# Patient Record
Sex: Male | Born: 1945 | Race: Black or African American | Hispanic: No | Marital: Married | Smoking: Former smoker
Health system: Southern US, Community
[De-identification: ages and names within clinical notes are randomized; demographics above are authoritative.]

---

## 2008-07-15 ENCOUNTER — Emergency Department (HOSPITAL_COMMUNITY): Admission: EM | Admit: 2008-07-15 | Discharge: 2008-07-15 | Payer: Self-pay | Admitting: Emergency Medicine

## 2010-07-17 LAB — BASIC METABOLIC PANEL
BUN: 25 mg/dL — ABNORMAL HIGH (ref 6–23)
Calcium: 9.2 mg/dL (ref 8.4–10.5)
GFR calc non Af Amer: 41 mL/min — ABNORMAL LOW (ref >60)
Glucose, Bld: 100 mg/dL — ABNORMAL HIGH (ref 70–99)
Sodium: 139 mEq/L (ref 135–145)

## 2011-01-05 ENCOUNTER — Emergency Department (HOSPITAL_COMMUNITY)
Admission: EM | Admit: 2011-01-05 | Discharge: 2011-01-05 | Disposition: A | Discharge status: Home / Self Care | Payer: BC Managed Care – PPO | Attending: Emergency Medicine | Admitting: Emergency Medicine

## 2011-01-05 ENCOUNTER — Encounter: Payer: Self-pay | Admitting: *Deleted

## 2011-01-05 DIAGNOSIS — Z79899 Other long term (current) drug therapy: Secondary | ICD-10-CM | POA: Insufficient documentation

## 2011-01-05 DIAGNOSIS — M25559 Pain in unspecified hip: Secondary | ICD-10-CM | POA: Insufficient documentation

## 2011-01-05 DIAGNOSIS — E1169 Type 2 diabetes mellitus with other specified complication: Secondary | ICD-10-CM | POA: Insufficient documentation

## 2011-01-05 DIAGNOSIS — R209 Unspecified disturbances of skin sensation: Secondary | ICD-10-CM | POA: Insufficient documentation

## 2011-01-05 DIAGNOSIS — E162 Hypoglycemia, unspecified: Secondary | ICD-10-CM

## 2011-01-05 DIAGNOSIS — M79609 Pain in unspecified limb: Secondary | ICD-10-CM | POA: Insufficient documentation

## 2011-01-05 DIAGNOSIS — M549 Dorsalgia, unspecified: Secondary | ICD-10-CM | POA: Insufficient documentation

## 2011-01-05 LAB — GLUCOSE, CAPILLARY: Glucose-Capillary: 187 mg/dL — ABNORMAL HIGH (ref 70–99)

## 2011-01-05 MED ORDER — IBUPROFEN 800 MG PO TABS
800.0000 mg | ORAL_TABLET | Freq: Once | ORAL | Status: AC
  Administered 2011-01-05: 800 mg via ORAL
  Filled 2011-01-05: qty 1

## 2011-01-05 NOTE — ED Notes (Signed)
Pt c/o lower left sided back pain radiating down left leg.

## 2011-01-05 NOTE — ED Notes (Signed)
Pt states he is also having pain in his left hamstring and the top of his left foot feels numb.

## 2011-01-05 NOTE — ED Notes (Signed)
Pt denies specific back pain at this time.  Reports occasionally feeling pain shoot down left leg.  States that top of left foot is numb, and back of thigh "just feels tight".  Denies any injury to back.  Reports he noticed pain and numbness when he woke up this morning at 2am.

## 2011-01-05 NOTE — ED Notes (Signed)
Pt alert and oriented x 3. Skin warm and dry. Color pink. Pt c/o pain in the back of his left leg and thigh. Pt denies injury but states that he has been going up and down the ladder a lot. Breakfast tray given for low blood sugar.

## 2011-01-05 NOTE — ED Provider Notes (Addendum)
History   Scribed for Nicholes Stairs, MD, the patient was seen in room APA11/APA11. This chart was scribed by Clarita Crane. This patient's care was started at 7:24AM.  CSN: [id number] Arrival date & time: 01/05/2011  5:01 AM  Chief Complaint  Patient presents with  . Back Pain  . Hip Pain  . Leg Pain   HPI Pedro Garcia is a 65 y.o. male who presents to the Emergency Department complaining of constant pain to left gluteal region radiating down LLE to toes described as aching onset 2 hours ago and persistent since with associated mild weakness of LLE and diffuse numbness to distal aspect of left lower leg. Denies n/v, fever, incontinence, dysuria and previous history of similar symptoms. Patient reports he has been working on a ladder an increasing amount recently. Patient with h/o diabetes controlled with Metformin since 2000 and states recent blood sugar measures have been running in the 40's and 50's with last measurement performed last night.   PAST MEDICAL HISTORY:  Past Medical History  Diagnosis Date  . Diabetes mellitus     PAST SURGICAL HISTORY:  History reviewed. No pertinent past surgical history.  FAMILY HISTORY:  Family History  Problem Relation Age of Onset  . Hypertension Mother   . Hypertension Father   . Diabetes Sister   . Diabetes Brother      SOCIAL HISTORY: History   Social History  . Marital Status: Married    Spouse Name: N/A    Number of Children: N/A  . Years of Education: N/A   Social History Main Topics  . Smoking status: Never Smoker   . Smokeless tobacco: None  . Alcohol Use: Yes     occasionally  . Drug Use: No  . Sexually Active:    Other Topics Concern  . None   Social History Narrative  . None     Review of Systems 10 Systems reviewed and are negative for acute change except as noted in the HPI.  Allergies  Review of patient's allergies indicates no known allergies.  Home Medications   Current Outpatient Rx  Name  Route Sig Dispense Refill  . GLIPIZIDE-METFORMIN HCL 5-500 MG PO TABS Oral Take 1 tablet by mouth 2 (two) times daily before a meal.      . PIOGLITAZONE HCL 45 MG PO TABS Oral Take 45 mg by mouth daily.      Marland Kitchen VALSARTAN-HYDROCHLOROTHIAZIDE 160-25 MG PO TABS Oral Take 1 tablet by mouth daily.        BP 159/88  Pulse 78  Temp 98.2 F (36.8 C)  Resp 20  Ht 6\' 3"  (1.905 m)  Wt 206 lb (93.441 kg)  BMI 25.75 kg/m2  SpO2 100%  Physical Exam  Nursing note and vitals reviewed. Constitutional: He is oriented to person, place, and time. He appears well-developed and well-nourished. No distress.  HENT:  Head: Normocephalic and atraumatic.  Eyes: EOM are normal. No scleral icterus.  Neck: Neck supple.  Cardiovascular: Normal rate and regular rhythm.   No murmur heard.      1+ DP pulse and 1+ PT to LLE.   Pulmonary/Chest: Effort normal and breath sounds normal. He has no wheezes.  Abdominal: Soft. Bowel sounds are normal. He exhibits no distension. There is no tenderness. There is no CVA tenderness.  Musculoskeletal: Normal range of motion. He exhibits no edema.       Entire spine non-tender. No pain with flexion/extension at left hip. Minimal pain with internal/external rotation  of left hip.   Neurological: He is alert and oriented to person, place, and time. He has normal strength. No sensory deficit.       Strength 5/5 in bilateral lower extremities.   Skin: Skin is warm and dry.  Psychiatric: He has a normal mood and affect. His behavior is normal.    ED Course  Procedures MDM   OTHER DATA REVIEWED: Nursing notes, vital signs, and past medical records reviewed. Lab results reviewed and considered Imaging results reviewed and considered  DIAGNOSTIC STUDIES: Oxygen Saturation is 100% on room air, normal by my interpretation.    LABS / RADIOLOGY: Results for orders placed during the hospital encounter of 01/05/11  GLUCOSE, CAPILLARY      Component Value Range    Glucose-Capillary 45 (*) 70 - 99 (mg/dL)   No results found.  PROCEDURES:  ED COURSE / COORDINATION OF CARE: Orders Placed This Encounter  Procedures  . Glucose, capillary  7:30AM- Bedside CBG performed with measurement of 45.   MDM: Differential Diagnosis:    PLAN: Discharge Home The patient is to return the emergency department if there is any worsening of symptoms. I have reviewed the discharge instructions with the patient/family  CONDITION ON DISCHARGE: Good.   DIAGNOSIS: Hypoglycemia Paresthesia  MEDICATIONS GIVEN IN THE E.D.  Medications  valsartan-hydrochlorothiazide (DIOVAN-HCT) 160-25 MG per tablet (not administered)  glipiZIDE-metformin (METAGLIP) 5-500 MG per tablet (not administered)  pioglitazone (ACTOS) 45 MG tablet (not administered)      Hypoglycemia No evidence of neurological deficits.  There is no indication that the patient has had a stroke or has I nerve compression.  I personally performed the services described in this documentation, which was scribed in my presence. The recorded information has been reviewed and considered.    Nicholes Stairs, MD 01/05/11 2956  Nicholes Stairs, MD 01/05/11 2130  Nicholes Stairs, MD 01/10/11 [phone]

## 2011-01-05 NOTE — ED Notes (Signed)
Pt given 2 more cups orange juice and a cup of applesauce for continued low blood sugar. Dr. Weldon Inches notified.

## 2011-06-13 ENCOUNTER — Other Ambulatory Visit: Payer: Self-pay

## 2011-06-13 ENCOUNTER — Emergency Department (HOSPITAL_COMMUNITY)
Admission: EM | Admit: 2011-06-13 | Discharge: 2011-06-14 | Disposition: A | Discharge status: Home / Self Care | Payer: 59 | Attending: Emergency Medicine | Admitting: Emergency Medicine

## 2011-06-13 ENCOUNTER — Encounter (HOSPITAL_COMMUNITY): Payer: Self-pay | Admitting: *Deleted

## 2011-06-13 DIAGNOSIS — R42 Dizziness and giddiness: Secondary | ICD-10-CM | POA: Insufficient documentation

## 2011-06-13 LAB — URINALYSIS, ROUTINE W REFLEX MICROSCOPIC
Bilirubin Urine: NEGATIVE
Glucose, UA: NEGATIVE mg/dL
Hgb urine dipstick: NEGATIVE
Ketones, ur: NEGATIVE mg/dL
Leukocytes, UA: NEGATIVE
Nitrite: NEGATIVE
Protein, ur: NEGATIVE mg/dL
Specific Gravity, Urine: 1.021 (ref 1.005–1.030)
Urobilinogen, UA: 0.2 mg/dL (ref 0.0–1.0)
pH: 5 (ref 5.0–8.0)

## 2011-06-13 LAB — DIFFERENTIAL
Basophils Absolute: 0 10*3/uL (ref 0.0–0.1)
Basophils Relative: 1 % (ref 0–1)
Eosinophils Absolute: 0.1 10*3/uL (ref 0.0–0.7)
Eosinophils Relative: 2 % (ref 0–5)
Lymphocytes Relative: 29 % (ref 12–46)
Lymphs Abs: 1.8 10*3/uL (ref 0.7–4.0)
Monocytes Absolute: 0.8 10*3/uL (ref 0.1–1.0)
Monocytes Relative: 13 % — ABNORMAL HIGH (ref 3–12)
Neutro Abs: 3.5 10*3/uL (ref 1.7–7.7)
Neutrophils Relative %: 56 % (ref 43–77)

## 2011-06-13 LAB — BASIC METABOLIC PANEL
BUN: 34 mg/dL — ABNORMAL HIGH (ref 6–23)
CO2: 26 mEq/L (ref 19–32)
Calcium: 9.8 mg/dL (ref 8.4–10.5)
Chloride: 102 mEq/L (ref 96–112)
Creatinine, Ser: 2.1 mg/dL — ABNORMAL HIGH (ref 0.50–1.35)
GFR calc Af Amer: 37 mL/min — ABNORMAL LOW (ref >90)
GFR calc non Af Amer: 32 mL/min — ABNORMAL LOW (ref >90)
Glucose, Bld: 136 mg/dL — ABNORMAL HIGH (ref 70–99)
Potassium: 4.6 mEq/L (ref 3.5–5.1)
Sodium: 138 mEq/L (ref 135–145)

## 2011-06-13 LAB — CBC
HCT: 40.5 % (ref 39.0–52.0)
Hemoglobin: 13.5 g/dL (ref 13.0–17.0)
MCH: 29.2 pg (ref 26.0–34.0)
MCHC: 33.3 g/dL (ref 30.0–36.0)
MCV: 87.5 fL (ref 78.0–100.0)
Platelets: 160 10*3/uL (ref 150–400)
RBC: 4.63 MIL/uL (ref 4.22–5.81)
RDW: 13.3 % (ref 11.5–15.5)
WBC: 6.3 10*3/uL (ref 4.0–10.5)

## 2011-06-13 NOTE — ED Notes (Signed)
Patient transported to CT 

## 2011-06-13 NOTE — ED Notes (Signed)
Pt c/o dizziness x1wk. Was seen by pcp who diagnosed pt with vertigo and was prescribed meclizine. Pt stated that meclizine did not work at all and has stopped taking medicine. Denies n/v. Pt states he hasnt fallen but states he almost has. Pt states when he turns his head a certain way it starts the dizziness.

## 2011-06-13 NOTE — ED Notes (Signed)
The pt has had dizzinesss for one week.  No nausea or vomiting.  No previous history.  No pain

## 2011-06-14 ENCOUNTER — Emergency Department (HOSPITAL_COMMUNITY): Payer: 59

## 2011-06-14 ENCOUNTER — Encounter (HOSPITAL_COMMUNITY): Payer: Self-pay | Admitting: Radiology

## 2011-06-14 MED ORDER — LORAZEPAM 1 MG PO TABS
1.0000 mg | ORAL_TABLET | Freq: Once | ORAL | Status: AC
  Administered 2011-06-14: 1 mg via ORAL
  Filled 2011-06-14: qty 1

## 2011-06-14 MED ORDER — LORAZEPAM 1 MG PO TABS: 1.0000 mg | ORAL_TABLET | Freq: Three times a day (TID) | ORAL | Status: AC | PRN

## 2011-06-14 NOTE — ED Notes (Signed)
Patient returned from CT

## 2011-06-14 NOTE — Discharge Instructions (Signed)
Please review the information below. The CT of the brain tonight had no acute findings. The report indicated normal age-related changes. Your EKG had some nonspecific findings that may or may not be new for you since we do not have another EKG to compare it to. You will need to follow up with your primary care physician regarding this matter as he may want to follow this up with stress testing. Your chemistry panel reveals that your BUN and creatinine were elevated (34 and 2.10 respectively) the last values that we have in our system on you were from April 2010 which were (25 and 1.69). This is also something that should be reevaluated by your physician to determine if this has been a slow increase or a sudden spike. You're not anemic, and you're remaining labs were normal. We have also provided the ENT referral that we discussed. It may be beneficial for you to followup with an ENT specialist for your persistent dizziness. Ultimately your primary care physician may want to arrange an outpatient MRI of your brain as well if symptoms persist. Please return for worsening symptoms otherwise follow up as discussed.     Dizziness Dizziness is a common problem. It is a feeling of unsteadiness or lightheadedness. You may feel like you are about to faint. Dizziness can lead to injury if you stumble or fall. A person of any age group can suffer from dizziness, but dizziness is more common in older adults. CAUSES  Dizziness can be caused by many different things, including:  Middle ear problems.   Standing for too long.   Infections.   An allergic reaction.   Aging.   An emotional response to something, such as the sight of blood.   Side effects of medicines.   Fatigue.   Problems with circulation or blood pressure.   Excess use of alcohol, medicines, or illegal drug use.   Breathing too fast (hyperventilation).   An arrhythmia or problems with your heart rhythm.   Low red blood cell count  (anemia).   Pregnancy.   Vomiting, diarrhea, fever, or other illnesses that cause dehydration.   Diseases or conditions such as Parkinson's disease, high blood pressure (hypertension), diabetes, and thyroid problems.   Exposure to extreme heat.  DIAGNOSIS  To find the cause of your dizziness, your caregiver may do a physical exam, lab tests, radiologic imaging scans, or an electrocardiography test (ECG).  TREATMENT  Treatment of dizziness depends on the cause of your symptoms and can vary greatly. HOME CARE INSTRUCTIONS   Drink enough fluids to keep your urine clear or pale yellow. This is especially important in very hot weather. In the elderly, it is also important in cold weather.   If your dizziness is caused by medicines, take them exactly as directed. When taking blood pressure medicines, it is especially important to get up slowly.   Rise slowly from chairs and steady yourself until you feel okay.   In the morning, first sit up on the side of the bed. When this seems okay, stand slowly while holding onto something until you know your balance is fine.   If you need to stand in one place for a long time, be sure to move your legs often. Tighten and relax the muscles in your legs while standing.   If dizziness continues to be a problem, have someone stay with you for a day or two. Do this until you feel you are well enough to stay alone. Have the person  call your caregiver if he or she notices changes in you that are concerning.   Do not drive or use heavy machinery if you feel dizzy.  SEEK IMMEDIATE MEDICAL CARE IF:   Your dizziness or lightheadedness gets worse.   You feel nauseous or vomit.   You develop problems with talking, walking, weakness, or using your arms, hands, or legs.   You are not thinking clearly or you have difficulty forming sentences. It may take a friend or family member to determine if your thinking is normal.   You develop chest pain, abdominal pain,  shortness of breath, or sweating.   Your vision changes.   You notice any bleeding.   You have side effects from medicine that seems to be getting worse rather than better.  MAKE SURE YOU:   Understand these instructions.   Will watch your condition.   Will get help right away if you are not doing well or get worse.  Document Released: 09/17/2000 Document Revised: 03/13/2011 Document Reviewed: 10/11/2010 [facility] [place] Patient Information 2012 [place], [place].Benign Positional Vertigo Vertigo means you feel like you or your surroundings are moving when they are not. Benign positional vertigo is the most common form of vertigo. Benign means that the cause of your condition is not serious. Benign positional vertigo is more common in older adults. CAUSES  Benign positional vertigo is the result of an upset in the labyrinth system. This is an area in the middle ear that helps control your balance. This may be caused by a viral infection, head injury, or repetitive motion. However, often no specific cause is found. SYMPTOMS  Symptoms of benign positional vertigo occur when you move your head or eyes in different directions. Some of the symptoms may include:  Loss of balance and falls.   Vomiting.   Blurred vision.   Dizziness.   Nausea.   Involuntary eye movements (nystagmus).  DIAGNOSIS  Benign positional vertigo is usually diagnosed by physical exam. If the specific cause of your benign positional vertigo is unknown, your caregiver may perform imaging tests, such as magnetic resonance imaging (MRI) or computed tomography (CT). TREATMENT  Your caregiver may recommend movements or procedures to correct the benign positional vertigo. Medicines such as meclizine, benzodiazepines, and medicines for nausea may be used to treat your symptoms. In rare cases, if your symptoms are caused by certain conditions that affect the inner ear, you may need surgery. HOME CARE INSTRUCTIONS   Follow your  caregiver's instructions.   Move slowly. Do not make sudden body or head movements.   Avoid driving.   Avoid operating heavy machinery.   Avoid performing any tasks that would be dangerous to you or others during a vertigo episode.   Drink enough fluids to keep your urine clear or pale yellow.  SEEK IMMEDIATE MEDICAL CARE IF:   You develop problems with walking, weakness, numbness, or using your arms, hands, or legs.   You have difficulty speaking.   You develop severe headaches.   Your nausea or vomiting continues or gets worse.   You develop visual changes.   Your family or friends notice any behavioral changes.   Your condition gets worse.   You have a fever.   You develop a stiff neck or sensitivity to light.  MAKE SURE YOU:   Understand these instructions.   Will watch your condition.   Will get help right away if you are not doing well or get worse.  Document Released: 12/30/2005 Document Revised: 03/13/2011 Document  Reviewed: 12/12/2010

## 2011-06-14 NOTE — ED Provider Notes (Signed)
History     CSN: [id number]  Arrival date & time 06/13/11  1944   First MD Initiated Contact with Patient 06/13/11 2247      Chief Complaint  Patient presents with  . Dizziness   HPI: Patient is a 66 y.o. male presenting with neurologic complaint. The history is provided by the patient.  Neurologic Problem The primary symptoms include dizziness. Primary symptoms do not include headaches, loss of consciousness, altered mental status, seizures, visual change, focal weakness, fever, nausea or vomiting. The symptoms began 5 to 7 days ago. The symptoms are waxing and waning. The neurological symptoms are diffuse. The symptoms occurred after standing up.  Dizziness does not occur with nausea or vomiting.  Additional symptoms include vertigo. Additional symptoms do not include neck stiffness or lower back pain. Medical issues do not include seizures, alcohol use or drug use.  Pt reports persistent dizziness x 1 week. States the dizziness sometimes occurs when he attempts to stand but also at times when he is lying still. He describes the dizziness as the room seems to spin. Was seen at an Urgent Care this week and was placed on Meclizine which  He states is not helping. Today he had an episode when he was attempting to look up in a cabinet  For something when he became extremely dizzy and felt like he was going to "black out".  Past Medical History  Diagnosis Date  . Diabetes mellitus     History reviewed. No pertinent past surgical history.  Family History  Problem Relation Age of Onset  . Hypertension Mother   . Hypertension Father   . Diabetes Sister   . Diabetes Brother     History  Substance Use Topics  . Smoking status: Never Smoker   . Smokeless tobacco: Not on file  . Alcohol Use: Yes     occasionally      Review of Systems  Constitutional: Negative.  Negative for fever.  HENT: Negative.  Negative for neck stiffness.   Eyes: Negative.   Respiratory: Negative.     Cardiovascular: Negative.   Gastrointestinal: Negative.  Negative for nausea and vomiting.  Genitourinary: Negative.   Musculoskeletal: Negative.   Skin: Negative.   Neurological: Positive for dizziness and vertigo. Negative for tremors, focal weakness, seizures, loss of consciousness, speech difficulty, numbness and headaches.  Hematological: Negative.   Psychiatric/Behavioral: Negative.  Negative for altered mental status.    Allergies  Review of patient's allergies indicates no known allergies.  Home Medications   Current Outpatient Rx  Name Route Sig Dispense Refill  . VITAMIN C PO Oral Take 1 tablet by mouth daily.    Marland Kitchen GLIPIZIDE-METFORMIN HCL 5-500 MG PO TABS Oral Take 1 tablet by mouth 2 (two) times daily before a meal.      . MECLIZINE HCL 25 MG PO TABS Oral Take 25 mg by mouth 3 (three) times daily as needed. For dizziness    . PIOGLITAZONE HCL 45 MG PO TABS Oral Take 45 mg by mouth daily.      Marland Kitchen VALSARTAN-HYDROCHLOROTHIAZIDE 160-25 MG PO TABS Oral Take 1 tablet by mouth daily.        BP 112/79  Pulse 60  Temp 98.2 F (36.8 C)  Resp 18  SpO2 100%  Physical Exam  Constitutional: He is oriented to person, place, and time. He appears well-developed and well-nourished.  HENT:  Head: Normocephalic and atraumatic.  Eyes: Conjunctivae are normal.  Neck: Neck supple.  Cardiovascular: Normal rate and  regular rhythm.   Pulmonary/Chest: Effort normal and breath sounds normal.  Abdominal: Soft. Bowel sounds are normal.  Musculoskeletal: Normal range of motion.  Neurological: He is alert and oriented to person, place, and time. He has normal strength and normal reflexes. No cranial nerve deficit. He displays a negative Romberg sign. Coordination normal. GCS eye subscore is 4. GCS verbal subscore is 5. GCS motor subscore is 6.  Skin: Skin is warm and dry.  Psychiatric: He has a normal mood and affect.    ED Course  Procedures   Date: 06/14/2011  Rate: 77  Rhythm:  sinus  rhythm  QRS Axis: normal  Intervals: normal  ST/T Wave abnormalities: nonspecific ST changes  Conduction Disutrbances:none  Narrative Interpretation:   Old EKG Reviewed: none available  Discussed findings w/ pt and spouse. Explained to pt EKG and renal lab findings will require f/u by his PCP in [place] since we have no previous records to compare EKG and last BUN/Creat here was 2010. Pt reassured by normal Ct findings and is in agreement w/ plan to arrange f/u w/ PCP. Will provide ENT referral for pt to arrange f/u as well if symptoms persist. \  Labs Reviewed  DIFFERENTIAL - Abnormal; Notable for the following:    Monocytes Relative 13 (*)    All other components within normal limits  BASIC METABOLIC PANEL - Abnormal; Notable for the following:    Glucose, Bld 136 (*)    BUN 34 (*)    Creatinine, Ser 2.10 (*)    GFR calc non Af Amer 32 (*)    GFR calc Af Amer 37 (*)    All other components within normal limits  URINALYSIS, ROUTINE W REFLEX MICROSCOPIC - Abnormal; Notable for the following:    APPearance CLOUDY (*)    All other components within normal limits  CBC   Ct Head Wo Contrast  06/14/2011  *RADIOLOGY REPORT*  Clinical Data: Mild left lower extremity weakness and numbness.  CT HEAD WITHOUT CONTRAST  Technique:  Contiguous axial images were obtained from the base of the skull through the vertex without contrast.  Comparison: 07/15/2008.  Findings: Minimally enlarged ventricles and subarachnoid spaces. Otherwise, normal appearing cerebral hemispheres and posterior fossa structures.  Normal size and position of the ventricles.  No intracranial hemorrhage, mass lesion or CT evidence of acute infarction.  Aplastic frontal sinuses.  IMPRESSION: Minimal, age appropriate atrophy.  No acute abnormality.  Original Report Authenticated By: Darrol Angel, M.D.     No diagnosis found.    MDM  HPI/PE and clinical findings c/w 1. Persistent dizziness (Sx's do have components of BPV,  no focal neurological findings, no weakness, no CP, SOB or unilateral sx's, Ct w/o acute findings EKG w/ non-specific ST changes but no acute findings, BUN/Creatitine elevated 34/2.10 from 25/1.69 in 07/2008 which may or may not be significant depending on whether rise has been gradual or acute (pt to f/u w/ PCP regarding EKG and renal lab findings)       Leanne Chang, NP 06/17/11 1751 \  Medical screening examination/treatment/procedure(s) were performed by non-physician practitioner and as supervising physician I was immediately available for consultation/collaboration.  Sunnie Nielsen, MD 06/18/11 [phone]

## 2017-10-15 ENCOUNTER — Emergency Department (HOSPITAL_COMMUNITY): Payer: No Typology Code available for payment source

## 2017-10-15 ENCOUNTER — Encounter (HOSPITAL_COMMUNITY): Payer: Self-pay | Admitting: *Deleted

## 2017-10-15 ENCOUNTER — Emergency Department (HOSPITAL_COMMUNITY)
Admission: EM | Admit: 2017-10-15 | Discharge: 2017-10-15 | Disposition: A | Discharge status: Home / Self Care | Payer: No Typology Code available for payment source | Attending: Emergency Medicine | Admitting: Emergency Medicine

## 2017-10-15 ENCOUNTER — Other Ambulatory Visit: Payer: Self-pay

## 2017-10-15 DIAGNOSIS — Z79899 Other long term (current) drug therapy: Secondary | ICD-10-CM | POA: Diagnosis not present

## 2017-10-15 DIAGNOSIS — R1084 Generalized abdominal pain: Secondary | ICD-10-CM | POA: Diagnosis present

## 2017-10-15 DIAGNOSIS — Z87891 Personal history of nicotine dependence: Secondary | ICD-10-CM | POA: Insufficient documentation

## 2017-10-15 DIAGNOSIS — E119 Type 2 diabetes mellitus without complications: Secondary | ICD-10-CM | POA: Diagnosis not present

## 2017-10-15 DIAGNOSIS — R918 Other nonspecific abnormal finding of lung field: Secondary | ICD-10-CM | POA: Diagnosis not present

## 2017-10-15 DIAGNOSIS — Z7984 Long term (current) use of oral hypoglycemic drugs: Secondary | ICD-10-CM | POA: Diagnosis not present

## 2017-10-15 LAB — COMPREHENSIVE METABOLIC PANEL
ALBUMIN: 3.7 g/dL (ref 3.5–5.0)
ALT: 27 U/L (ref 0–44)
AST: 36 U/L (ref 15–41)
Alkaline Phosphatase: 86 U/L (ref 38–126)
Anion gap: 7 (ref 5–15)
BUN: 33 mg/dL — AB (ref 8–23)
CHLORIDE: 102 mmol/L (ref 98–111)
CO2: 27 mmol/L (ref 22–32)
Calcium: 9.1 mg/dL (ref 8.9–10.3)
Creatinine, Ser: 2.51 mg/dL — ABNORMAL HIGH (ref 0.61–1.24)
GFR calc Af Amer: 28 mL/min — ABNORMAL LOW (ref >60)
GFR, EST NON AFRICAN AMERICAN: 24 mL/min — AB (ref >60)
Glucose, Bld: 141 mg/dL — ABNORMAL HIGH (ref 70–99)
POTASSIUM: 4.2 mmol/L (ref 3.5–5.1)
SODIUM: 136 mmol/L (ref 135–145)
Total Bilirubin: 0.7 mg/dL (ref 0.3–1.2)
Total Protein: 7.3 g/dL (ref 6.5–8.1)

## 2017-10-15 LAB — LACTIC ACID, PLASMA: Lactic Acid, Venous: 0.7 mmol/L (ref 0.5–1.9)

## 2017-10-15 LAB — URINALYSIS, ROUTINE W REFLEX MICROSCOPIC
BILIRUBIN URINE: NEGATIVE
Glucose, UA: NEGATIVE mg/dL
HGB URINE DIPSTICK: NEGATIVE
Ketones, ur: NEGATIVE mg/dL
Leukocytes, UA: NEGATIVE
Nitrite: NEGATIVE
PH: 6 (ref 5.0–8.0)
Protein, ur: NEGATIVE mg/dL
SPECIFIC GRAVITY, URINE: 1.013 (ref 1.005–1.030)

## 2017-10-15 LAB — CBC WITH DIFFERENTIAL/PLATELET
BASOS PCT: 0 %
Basophils Absolute: 0 10*3/uL (ref 0.0–0.1)
Eosinophils Absolute: 0.1 10*3/uL (ref 0.0–0.7)
Eosinophils Relative: 1 %
HEMATOCRIT: 40.3 % (ref 39.0–52.0)
HEMOGLOBIN: 13.2 g/dL (ref 13.0–17.0)
LYMPHS PCT: 22 %
Lymphs Abs: 1.5 10*3/uL (ref 0.7–4.0)
MCH: 29.1 pg (ref 26.0–34.0)
MCHC: 32.8 g/dL (ref 30.0–36.0)
MCV: 88.8 fL (ref 78.0–100.0)
MONOS PCT: 14 %
Monocytes Absolute: 1 10*3/uL (ref 0.1–1.0)
NEUTROS ABS: 4.3 10*3/uL (ref 1.7–7.7)
NEUTROS PCT: 63 %
Platelets: 203 10*3/uL (ref 150–400)
RBC: 4.54 MIL/uL (ref 4.22–5.81)
RDW: 12.4 % (ref 11.5–15.5)
WBC: 6.8 10*3/uL (ref 4.0–10.5)

## 2017-10-15 LAB — POC OCCULT BLOOD, ED: Fecal Occult Bld: NEGATIVE

## 2017-10-15 LAB — PROTIME-INR
INR: 0.91
Prothrombin Time: 12.2 seconds (ref 11.4–15.2)

## 2017-10-15 LAB — TROPONIN I: Troponin I: <0.03 ng/mL (ref <0.03)

## 2017-10-15 LAB — LIPASE, BLOOD: LIPASE: 58 U/L — AB (ref 11–51)

## 2017-10-15 MED ORDER — IOPAMIDOL (ISOVUE-300) INJECTION 61%
30.0000 mL | Freq: Once | INTRAVENOUS | Status: AC | PRN
  Administered 2017-10-15: 30 mL via ORAL

## 2017-10-15 MED ORDER — SODIUM CHLORIDE 0.9 % IV BOLUS
500.0000 mL | Freq: Once | INTRAVENOUS | Status: AC
  Administered 2017-10-15: 500 mL via INTRAVENOUS

## 2017-10-15 MED ORDER — SODIUM CHLORIDE 0.9 % IV SOLN
INTRAVENOUS | Status: DC
  Administered 2017-10-15: 17:00:00 via INTRAVENOUS

## 2017-10-15 MED ORDER — DICYCLOMINE HCL 20 MG PO TABS: 20.0000 mg | ORAL_TABLET | Freq: Four times a day (QID) | ORAL | 0 refills | Status: DC | PRN

## 2017-10-15 NOTE — ED Provider Notes (Signed)
[facility] EMERGENCY DEPARTMENT Provider Note   CSN: [id number] Arrival date & time: 10/15/17  1511     History   Chief Complaint Chief Complaint  Patient presents with  . Abdominal Pain    HPI Pedro Garcia is a 72 y.o. male.  HPI  Pt was seen at 1550.  Per pt and his wife, c/o gradual onset and persistence of constant generalized abd "pain" for the past 1 week. Pt states he has had intermittent abd pain for at least the past 2 months but "didn't mention it" to his PMD at the [facility]. Pt states last week he was constipated, took Pepto Bismol. Pt states his stools were then "black," which resolved 5 days ago. States his stools are now "just loose and Seckel." Describes the abd pain as "aching."  [facility], then told to come to the ED for further evaluation for possible ileus seen on XR. Denies N/V, no fevers, no back pain, no rash, no CP/SOB, no blood in stools.      Past Medical History:  Diagnosis Date  . Diabetes mellitus     There are no active problems to display for this patient.   History reviewed. No pertinent surgical history.      Home Medications    Prior to Admission medications   Medication Sig Start Date End Date Taking? Authorizing Provider  Ascorbic Acid (VITAMIN C PO) Take 1 tablet by mouth daily.    [provider]  glipiZIDE-metformin (METAGLIP) 5-500 MG per tablet Take 1 tablet by mouth 2 (two) times daily before a meal.      [provider]  meclizine (ANTIVERT) 25 MG tablet Take 25 mg by mouth 3 (three) times daily as needed. For dizziness    [provider]  pioglitazone (ACTOS) 45 MG tablet Take 45 mg by mouth daily.      [provider]  valsartan-hydrochlorothiazide (DIOVAN-HCT) 160-25 MG per tablet Take 1 tablet by mouth daily.      [provider]    Family History Family History  Problem Relation Age of Onset  . Hypertension Mother   . Hypertension Father   . Diabetes Sister   .  Diabetes Brother     Social History Social History   Tobacco Use  . Smoking status: Former Games developermoker  . Smokeless tobacco: Never Used  Substance Use Topics  . Alcohol use: Yes    Comment: occasionally  . Drug use: No     Allergies   Patient has no known allergies.   Review of Systems Review of Systems ROS: Statement: All systems negative except as marked or noted in the HPI; Constitutional: Negative for fever and chills. ; ; Eyes: Negative for eye pain, redness and discharge. ; ; ENMT: Negative for ear pain, hoarseness, nasal congestion, sinus pressure and sore throat. ; ; Cardiovascular: Negative for chest pain, palpitations, diaphoresis, dyspnea and peripheral edema. ; ; Respiratory: Negative for cough, wheezing and stridor. ; ; Gastrointestinal: +constipation, diarrhea, abd pain. Negative for nausea, vomiting, blood in stool, hematemesis, jaundice and rectal bleeding. . ; ; Genitourinary: Negative for dysuria, flank pain and hematuria. ; ; Musculoskeletal: Negative for back pain and neck pain. Negative for swelling and trauma.; ; Skin: Negative for pruritus, rash, abrasions, blisters, bruising and skin lesion.; ; Neuro: Negative for headache, lightheadedness and neck stiffness. Negative for weakness, altered level of consciousness, altered mental status, extremity weakness, paresthesias, involuntary movement, seizure and syncope.  Physical Exam Updated Vital Signs BP 130/75 (BP Location: Right Arm)   Pulse 81   Temp 98.4 F (36.9 C) (Oral)   Resp 16   Ht 6\' 2"  (1.88 m)   Wt 78 kg (172 lb)   SpO2 100%   BMI 22.08 kg/m   BP 132/80   Pulse 66   Temp 98.4 F (36.9 C) (Oral)   Resp 18   Ht 6\' 2"  (1.88 m)   Wt 78 kg (172 lb)   SpO2 99%   BMI 22.08 kg/m   Physical Exam 1555: Physical examination:  Nursing notes reviewed; Vital signs and O2 SAT reviewed;  Constitutional: Well developed, Well nourished, Well hydrated, In no acute distress; Head:  Normocephalic,  atraumatic; Eyes: EOMI, PERRL, No scleral icterus; ENMT: Mouth and pharynx normal, Mucous membranes moist; Neck: Supple, Full range of motion, No lymphadenopathy; Cardiovascular: Regular rate and rhythm, No gallop; Respiratory: Breath sounds clear & equal bilaterally, No wheezes.  Speaking full sentences with ease, Normal respiratory effort/excursion; Chest: Nontender, Movement normal; Abdomen: Soft, +mild diffuse tenderness to palp, softly distended and tympanitic. Increased bowel sounds. Rectal exam performed w/permission of pt and ED RN chaperone present.  Anal tone normal.  Non-tender, scant stool in rectal vault, heme neg.  No fissures, no external hemorrhoids, no palp masses.;;; Genitourinary: No CVA tenderness; Extremities: Peripheral pulses normal, No tenderness, No edema, No calf edema or asymmetry.; Neuro: AA&Ox3, Major CN grossly intact.  Speech clear. No gross focal motor or sensory deficits in extremities.; Skin: Color normal, Warm, Dry.    ED Treatments / Results  Labs (all labs ordered are listed, but only abnormal results are displayed)   EKG EKG Interpretation  Date/Time:  Thursday October 15 2017 17:01:13 EDT Ventricular Rate:  70 PR Interval:    QRS Duration: 90 QT Interval:  392 QTC Calculation: 423 R Axis:   72 Text Interpretation:  Sinus rhythm Baseline wander When compared with ECG of 06/13/2011 No significant change was found Confirmed by Samuel Jester [phone]) on 10/15/2017 5:04:49 PM   Radiology   Procedures Procedures (including critical care time)  Medications Ordered in ED Medications - No data to display   Initial Impression / Assessment and Plan / ED Course  I have reviewed the triage vital signs and the nursing notes.  Pertinent labs & imaging results that were available during my care of the patient were reviewed by me and considered in my medical decision making (see chart for details).  MDM Reviewed: previous chart, nursing note and  vitals Reviewed previous: labs Interpretation: labs, x-ray and CT scan    Results for orders placed or performed during the hospital encounter of 10/15/17  Comprehensive metabolic panel  Result Value Ref Range   Sodium 136 135 - 145 mmol/L   Potassium 4.2 3.5 - 5.1 mmol/L   Chloride 102 98 - 111 mmol/L   CO2 27 22 - 32 mmol/L   Glucose, Bld 141 (H) 70 - 99 mg/dL   BUN 33 (H) 8 - 23 mg/dL   Creatinine, Ser 6.04 (H) 0.61 - 1.24 mg/dL   Calcium 9.1 8.9 - 54.0 mg/dL   Total Protein 7.3 6.5 - 8.1 g/dL   Albumin 3.7 3.5 - 5.0 g/dL   AST 36 15 - 41 U/L   ALT 27 0 - 44 U/L   Alkaline Phosphatase 86 38 - 126 U/L   Total Bilirubin 0.7 0.3 - 1.2 mg/dL   GFR calc non Af Amer 24 (L) >60 mL/min  GFR calc Af Amer 28 (L) >60 mL/min   Anion gap 7 5 - 15  Lipase, blood  Result Value Ref Range   Lipase 58 (H) 11 - 51 U/L  Lactic acid, plasma  Result Value Ref Range   Lactic Acid, Venous 0.7 0.5 - 1.9 mmol/L  CBC with Differential  Result Value Ref Range   WBC 6.8 4.0 - 10.5 K/uL   RBC 4.54 4.22 - 5.81 MIL/uL   Hemoglobin 13.2 13.0 - 17.0 g/dL   HCT 16.1 09.6 - 04.5 %   MCV 88.8 78.0 - 100.0 fL   MCH 29.1 26.0 - 34.0 pg   MCHC 32.8 30.0 - 36.0 g/dL   RDW 40.9 81.1 - 91.4 %   Platelets 203 150 - 400 K/uL   Neutrophils Relative % 63 %   Neutro Abs 4.3 1.7 - 7.7 K/uL   Lymphocytes Relative 22 %   Lymphs Abs 1.5 0.7 - 4.0 K/uL   Monocytes Relative 14 %   Monocytes Absolute 1.0 0.1 - 1.0 K/uL   Eosinophils Relative 1 %   Eosinophils Absolute 0.1 0.0 - 0.7 K/uL   Basophils Relative 0 %   Basophils Absolute 0.0 0.0 - 0.1 K/uL  Protime-INR  Result Value Ref Range   Prothrombin Time 12.2 11.4 - 15.2 seconds   INR 0.91   Urinalysis, Routine w reflex microscopic  Result Value Ref Range   Color, Urine YELLOW YELLOW   APPearance CLEAR CLEAR   Specific Gravity, Urine 1.013 1.005 - 1.030   pH 6.0 5.0 - 8.0   Glucose, UA NEGATIVE NEGATIVE mg/dL   Hgb urine dipstick NEGATIVE NEGATIVE    Bilirubin Urine NEGATIVE NEGATIVE   Ketones, ur NEGATIVE NEGATIVE mg/dL   Protein, ur NEGATIVE NEGATIVE mg/dL   Nitrite NEGATIVE NEGATIVE   Leukocytes, UA NEGATIVE NEGATIVE  Troponin I  Result Value Ref Range   Troponin I <0.03 <0.03 ng/mL  POC occult blood, ED  Result Value Ref Range   Fecal Occult Bld NEGATIVE NEGATIVE   Ct Abdomen Pelvis Wo Contrast Result Date: 10/15/2017 CLINICAL DATA:  Nausea, abdominal pain, and dark stools. Possible ileus. Diabetes. Elevated creatinine. EXAM: CT ABDOMEN AND PELVIS WITHOUT CONTRAST TECHNIQUE: Multidetector CT imaging of the abdomen and pelvis was performed following the standard protocol without IV contrast. COMPARISON:  None. FINDINGS: Lower chest: Bibasilar pulmonary nodules including at up to 5 mm in the lingula on 12/4. Minimal motion degradation at the lung bases. Normal heart size without pericardial or pleural effusion. Multivessel coronary artery atherosclerosis. Hepatobiliary: Well-circumscribed low-density liver lesions are likely cysts. Normal gallbladder, without biliary ductal dilatation. Pancreas: Normal, without mass or ductal dilatation. Spleen: Normal in size, without focal abnormality. Adrenals/Urinary Tract: Normal adrenal glands. Mild renal cortical thinning bilaterally. Bilateral low-density renal lesions are likely cysts. No renal calculi or hydronephrosis. No hydroureter or ureteric calculi. No bladder calculi. Stomach/Bowel: Normal stomach, without wall thickening. Scattered colonic diverticula. Normal terminal ileum and appendix. Normal small bowel. Vascular/Lymphatic: Aortic and branch vessel atherosclerosis. No abdominopelvic adenopathy. Reproductive: Moderate prostatomegaly. Other: No significant free fluid.  No free intraperitoneal air. Musculoskeletal: Degenerative partial fusion of the left sacroiliac joint. IMPRESSION: 1.  No acute process in the abdomen or pelvis. 2. Coronary artery atherosclerosis. Aortic Atherosclerosis  (ICD10-I70.0). 3. Bibasilar pulmonary nodules. No follow-up needed if patient is low-risk. Non-contrast chest CT can be considered in 12 months if patient is high-risk. This recommendation follows the consensus statement: Guidelines for Management of Incidental Pulmonary Nodules Detected on CT Images:  From the Fleischner Society 2017; Radiology 2017; [phone]. 4. Prostatomegaly. Electronically Signed   By: Jeronimo Greaves M.D.   On: 10/15/2017 20:12   Dg Chest 2 View Result Date: 10/15/2017 CLINICAL DATA:  Nausea, abdominal pain and black stools worsening of the last 2 days, progressed over the past 2 months. EXAM: CHEST - 2 VIEW COMPARISON:  None. FINDINGS: Heart size and mediastinal contours are within normal limits. Lungs are clear. No pleural effusion or pneumothorax seen. No acute or suspicious osseous finding. IMPRESSION: No active cardiopulmonary disease. Electronically Signed   By: Bary Richard M.D.   On: 10/15/2017 16:37   Results for JOH, RAO (MRN [id number]) as of 10/15/2017 17:05  Ref. Range 06/13/2011 22:59 10/15/2017 16:11  BUN Latest Ref Range: 8 - 23 mg/dL 34 (H) 33 (H)  Creatinine Latest Ref Range: 0.61 - 1.24 mg/dL 7.82 (H) 9.56 (H)    2130:  Pt has tol PO well while in the ED without N/V.  No stooling while in the ED.  Abd benign, VSS. Feels better and wants to go home now. Workup reassuring. No clear indication for admission at this time. Dx and testing d/w pt and family.  Questions answered.  Verb understanding, agreeable to d/c home with outpt f/u.     Final Clinical Impressions(s) / ED Diagnoses   Final diagnoses:  None    ED Discharge Orders    None       Samuel Jester, DO 10/18/17 1729

## 2017-10-15 NOTE — ED Notes (Signed)
Pt transported to CT ?

## 2017-10-15 NOTE — ED Notes (Signed)
Ginger ale given to patient. 

## 2017-10-15 NOTE — ED Notes (Signed)
Pt has tolerated the Ginger ale given to drink.  Pt denies nausea at this time.

## 2017-10-15 NOTE — ED Triage Notes (Signed)
Pt c/o nausea, abdominal pain and stool black and hard that worsened over the last 2-3 days, progressive over the past 2 months. Pt reports his stool is now loose and [place]. Pt was seen at Urgent Care today and they did an xray and pt was found to have an ileus. Pt sent to ED for evaluation. Denies vomiting.

## 2017-10-15 NOTE — Discharge Instructions (Addendum)
Eat a bland diet, avoiding greasy, fatty, fried foods, as well as spicy and acidic foods or beverages.  Avoid eating within 2 to 3 hours before going to bed or laying down.  Also avoid teas, colas, coffee, chocolate, pepermint and spearment.  Take over the counter pepcid, one tablet by mouth twice a day, for the next 2 to 3 weeks. Take the prescription as directed. Your CT scan showed an incidental finding:  "Bibasilar pulmonary nodules. No follow-up needed if patient is low-risk. Non-contrast chest CT can be considered in 12 months if patient is high-risk. This recommendation follows the consensus statement: guidelines for Management of Incidental Pulmonary Nodules Detected on CT Images: From the Fleischner Society 2017; Radiology 2017; [phone]:[id number]." Your regular medical doctor can follow up this finding. Call your regular medical doctor tomorrow to schedule a follow up appointment within the next 3 days. Call the GI doctor tomorrow to schedule a follow up appointment within the next week.  Return to the Emergency Department immediately sooner if worsening.

## 2019-05-12 IMAGING — DX DG CHEST 2V
2 series · 2 of 2 positions shown · non-contrast
Comparison: None.

CLINICAL DATA: Nausea, abdominal pain and black stools worsening of
the last 2 days, progressed over the past 2 months.

EXAM:
CHEST - 2 VIEW

[chest pa]
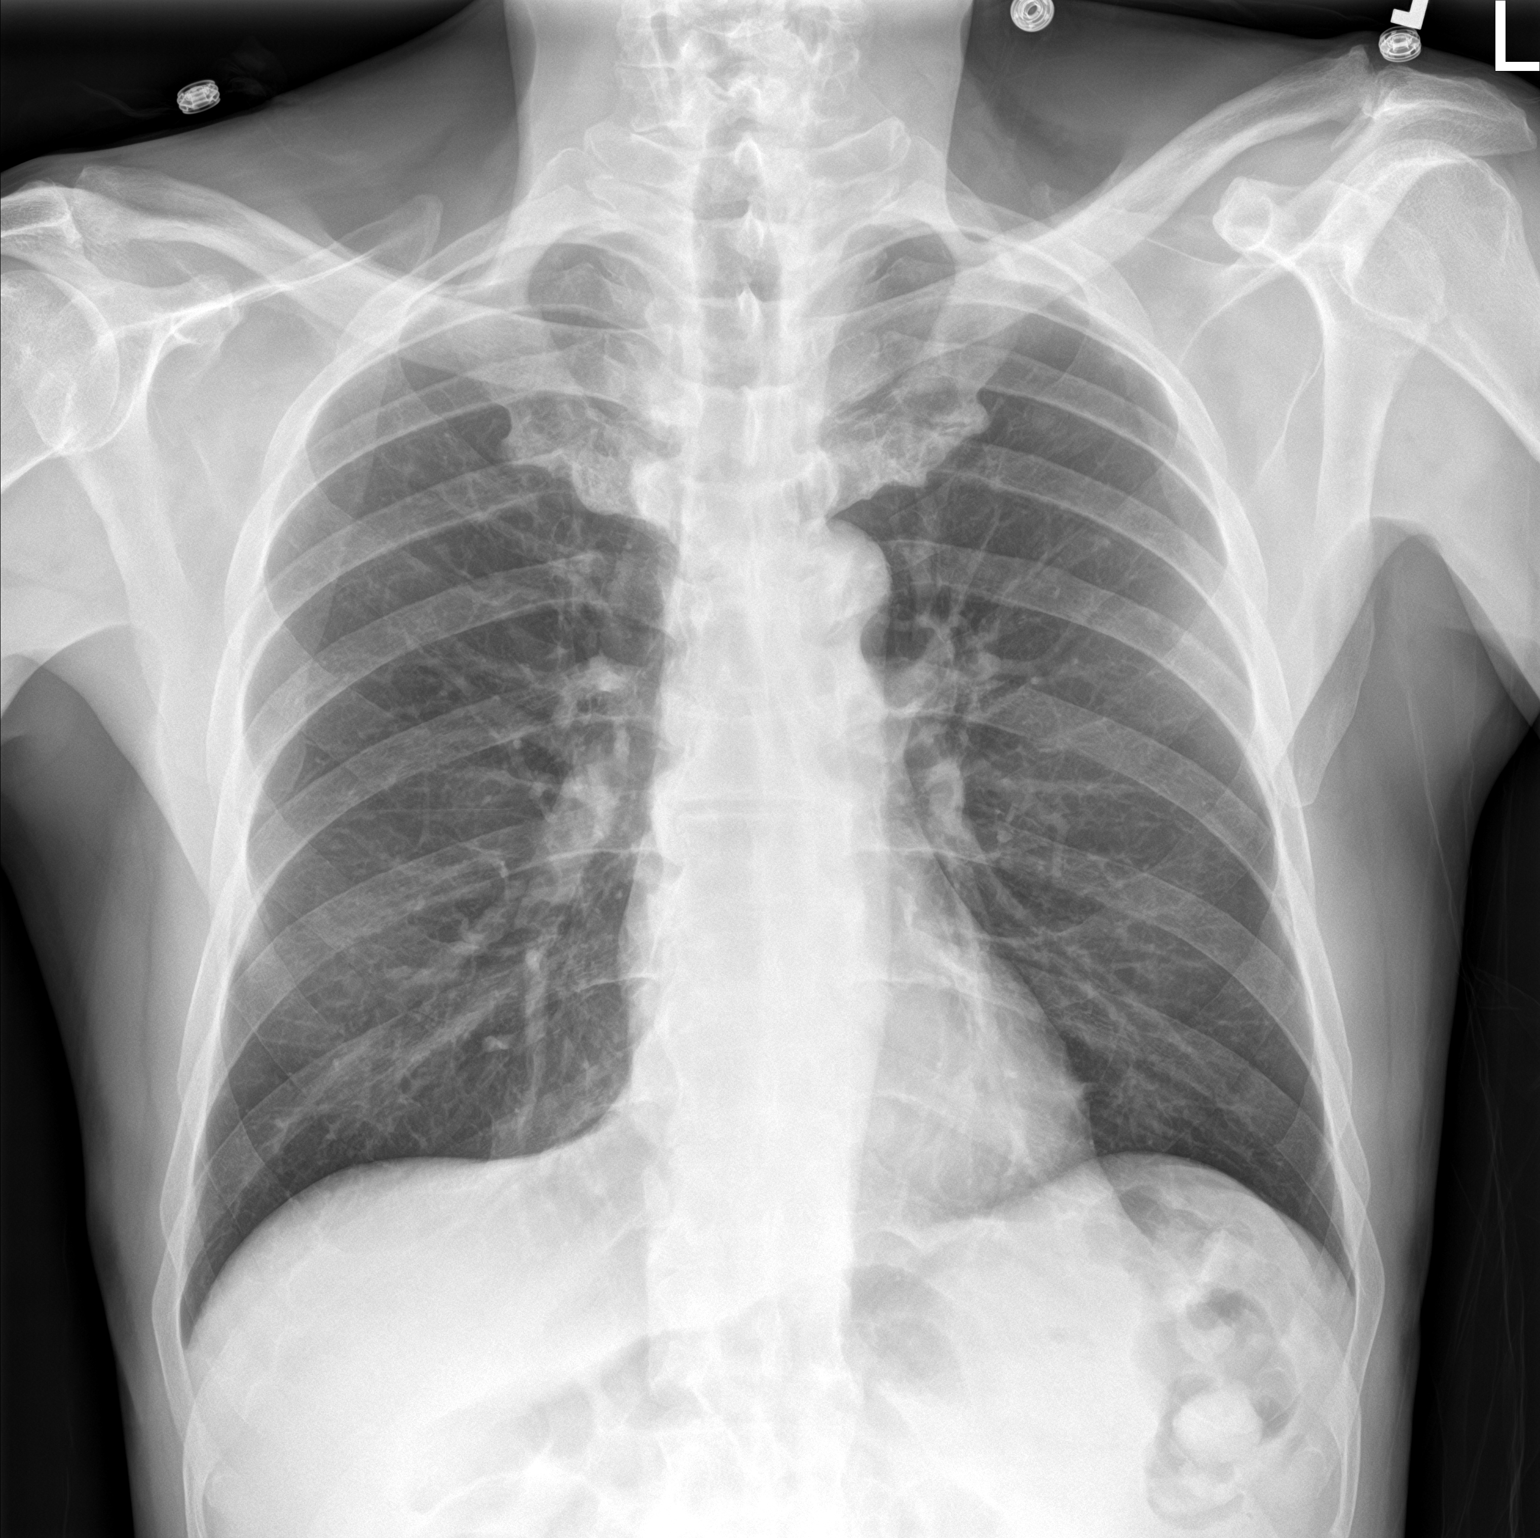

[chest lat]
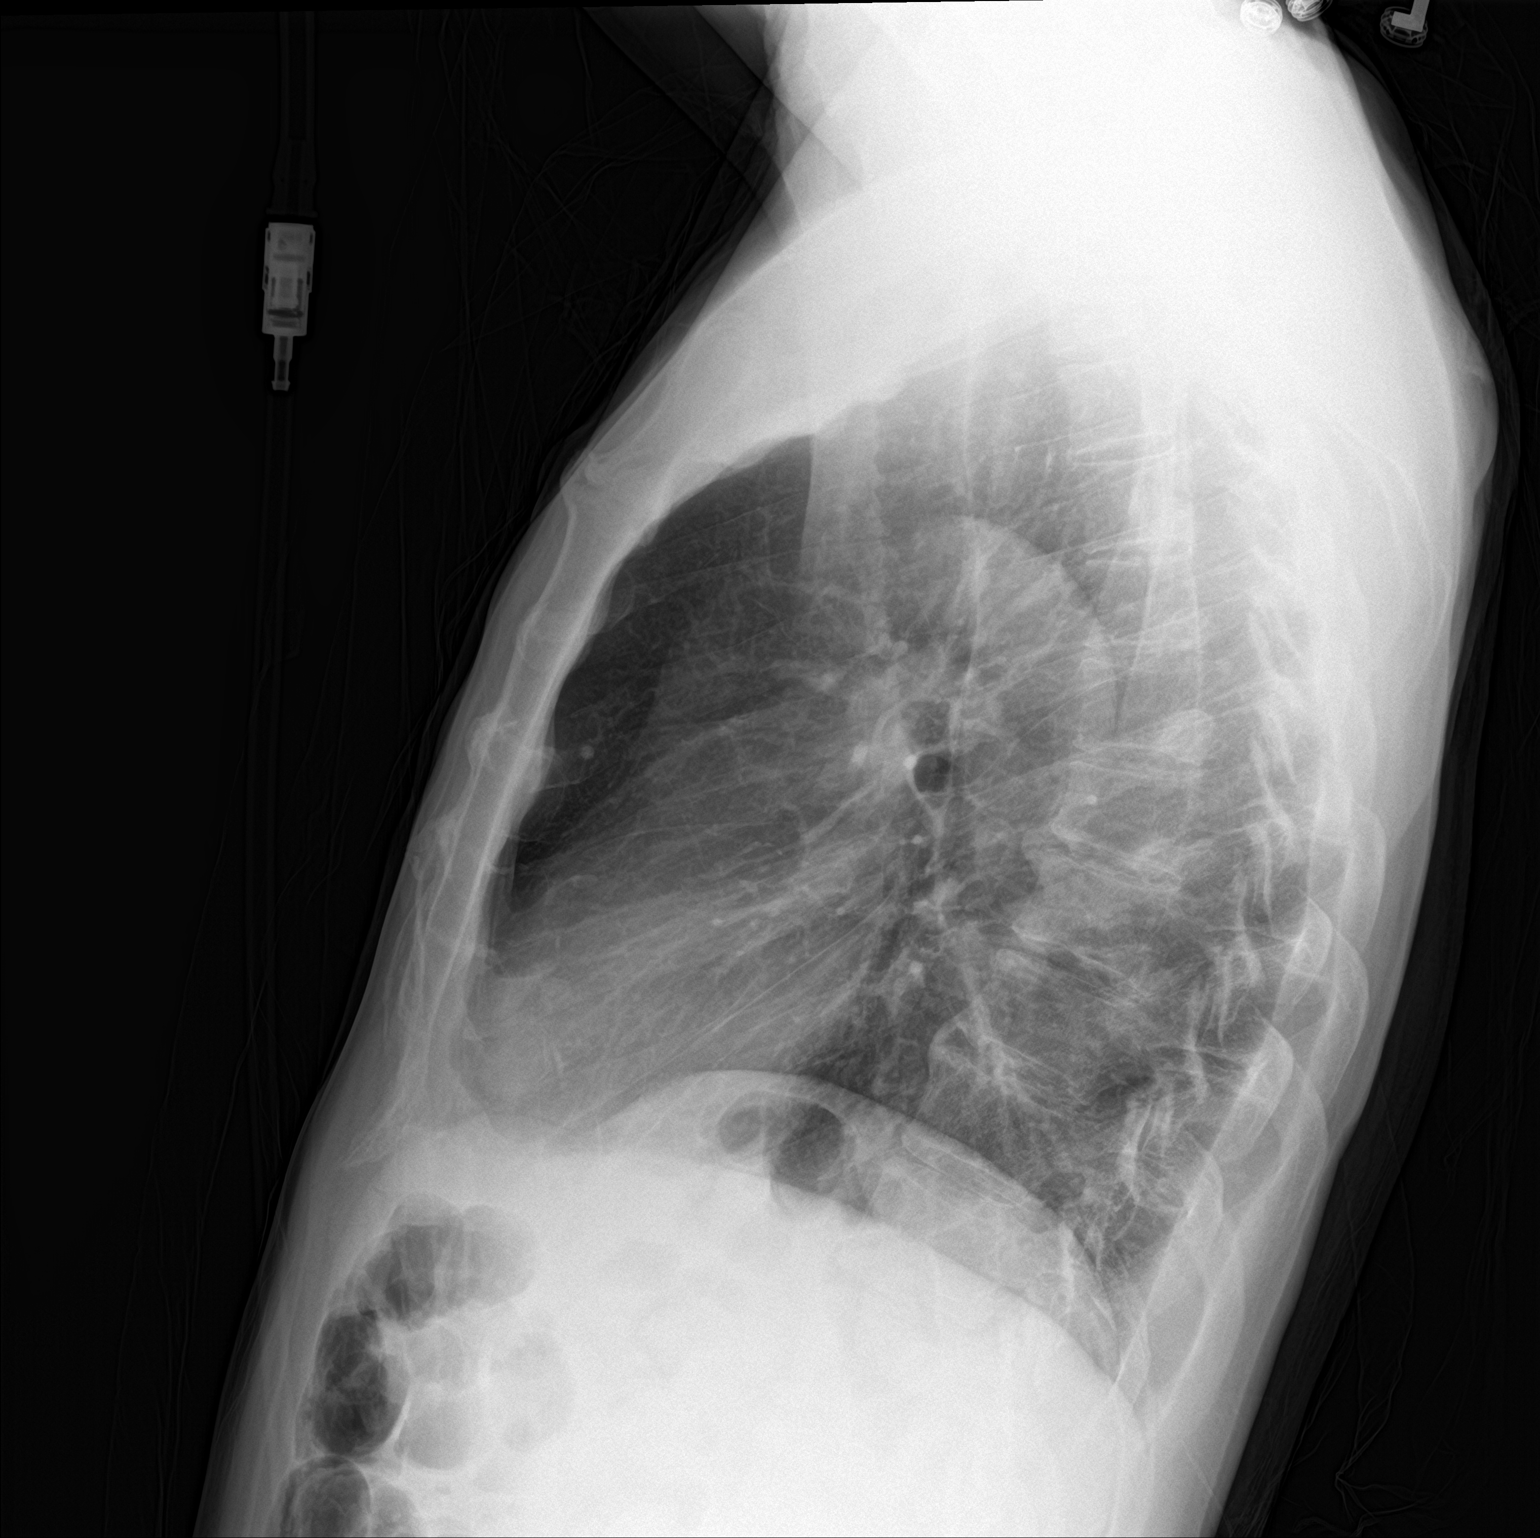

[2 of 2 positions shown; findings below may reference images not displayed]

FINDINGS: Heart size and mediastinal contours are within normal limits. Lungs
are clear. No pleural effusion or pneumothorax seen. No acute or
suspicious osseous finding.
IMPRESSION: No active cardiopulmonary disease.

## 2022-01-24 ENCOUNTER — Ambulatory Visit (INDEPENDENT_AMBULATORY_CARE_PROVIDER_SITE_OTHER): Payer: Medicare Other | Admitting: Podiatry

## 2022-01-24 DIAGNOSIS — E1149 Type 2 diabetes mellitus with other diabetic neurological complication: Secondary | ICD-10-CM

## 2022-01-24 DIAGNOSIS — R209 Unspecified disturbances of skin sensation: Secondary | ICD-10-CM

## 2022-01-24 DIAGNOSIS — B351 Tinea unguium: Secondary | ICD-10-CM

## 2022-01-24 MED ORDER — GABAPENTIN 100 MG PO CAPS: 100.0000 mg | ORAL_CAPSULE | Freq: Every day | ORAL | 0 refills | Status: AC

## 2022-01-24 NOTE — Progress Notes (Unsigned)
Subjective:   Patient ID: Pedro Garcia, male   DOB: 76 y.o.   MRN: [id number]   HPI Chief Complaint  Patient presents with   Diabetes    Diabetic foot care, A1c- 7.7 BG- 93, feet are always cold, Nail trim     He gets cold sensations in his feet and hands. No open lesions. No injuries. He walks about 1/2 mile for exercise, no pain in the legs. The pain is when he sits for a long time.  No swelling.    Review of Systems  All other systems reviewed and are negative.  Past Medical History:  Diagnosis Date   Diabetes mellitus     No past surgical history on file.   Current Outpatient Medications:    empagliflozin (JARDIANCE) 25 MG TABS tablet, Take 1 tablet by mouth daily., Disp: , Rfl:    pioglitazone (ACTOS) 15 MG tablet, TAKE ONE TABLET BY MOUTH EVERY DAY FOR DIABETES MELLITUS, Disp: , Rfl:    atorvastatin (LIPITOR) 10 MG tablet, Take 10 mg by mouth daily., Disp: , Rfl:   No Known Allergies         Objective:  Physical Exam  General: AAO x3, NAD  Dermatological: Skin is warm, dry and supple bilateral. Nails x 10 are well manicured; remaining integument appears unremarkable at this time. There are no open sores, no preulcerative lesions, no rash or signs of infection present.  Vascular: Dorsalis Pedis artery and Posterior Tibial artery pedal pulses are 2/4 bilateral with immedate capillary fill time. Pedal hair growth present. No varicosities and no lower extremity edema present bilateral. There is no pain with calf compression, swelling, warmth, erythema.   Neruologic: Grossly intact via light touch bilateral. Vibratory intact via tuning fork bilateral. Protective threshold with Semmes Wienstein monofilament intact to all pedal sites bilateral. Patellar and Achilles deep tendon reflexes 2+ bilateral. No Babinski or clonus noted bilateral.   Musculoskeletal: No gross boney pedal deformities bilateral. No pain, crepitus, or limitation noted with foot and ankle range of  motion bilateral. Muscular strength 5/5 in all groups tested bilateral.  Gait: Unassisted, Nonantalgic.       Assessment:  ***     Plan:  ***

## 2022-01-24 NOTE — Patient Instructions (Addendum)
For topical medications to help with the feet you can try capsaicin cream and Aspercreme.   ---  If was nice to meet you today. If you have any questions or any further concerns, please feel fee to give me a call. You can call our office at [phone] or please feel fee to send me a message through MyChart.   ----  Diabetes Mellitus and Foot Care Foot care is an important part of your health, especially when you have diabetes. Diabetes may cause you to have problems because of poor blood flow (circulation) to your feet and legs, which can cause your skin to: Become thinner and drier. Break more easily. Heal more slowly. Peel and crack. You may also have nerve damage (neuropathy) in your legs and feet, causing decreased feeling in them. This means that you may not notice minor injuries to your feet that could lead to more serious problems. Noticing and addressing any potential problems early is the best way to prevent future foot problems. How to care for your feet Foot hygiene  Wash your feet daily with warm water and mild soap. Do not use hot water. Then, pat your feet and the areas between your toes until they are completely dry. Do not soak your feet as this can dry your skin. Trim your toenails straight across. Do not dig under them or around the cuticle. File the edges of your nails with an emery board or nail file. Apply a moisturizing lotion or petroleum jelly to the skin on your feet and to dry, brittle toenails. Use lotion that does not contain alcohol and is unscented. Do not apply lotion between your toes. Shoes and socks Wear clean socks or stockings every day. Make sure they are not too tight. Do not wear knee-high stockings since they may decrease blood flow to your legs. Wear shoes that fit properly and have enough cushioning. Always look in your shoes before you put them on to be sure there are no objects inside. To break in new shoes, wear them for just a few hours a day.  This prevents injuries on your feet. Wounds, scrapes, corns, and calluses  Check your feet daily for blisters, cuts, bruises, sores, and redness. If you cannot see the bottom of your feet, use a mirror or ask someone for help. Do not cut corns or calluses or try to remove them with medicine. If you find a minor scrape, cut, or break in the skin on your feet, keep it and the skin around it clean and dry. You may clean these areas with mild soap and water. Do not clean the area with peroxide, alcohol, or iodine. If you have a wound, scrape, corn, or callus on your foot, look at it several times a day to make sure it is healing and not infected. Check for: Redness, swelling, or pain. Fluid or blood. Warmth. Pus or a bad smell. General tips Do not cross your legs. This may decrease blood flow to your feet. Do not use heating pads or hot water bottles on your feet. They may burn your skin. If you have lost feeling in your feet or legs, you may not know this is happening until it is too late. Protect your feet from hot and cold by wearing shoes, such as at the beach or on hot pavement. Schedule a complete foot exam at least once a year (annually) or more often if you have foot problems. Report any cuts, sores, or bruises to your health  care provider immediately. Where to find more information American Diabetes Association: www.diabetes.org Association of Diabetes Care & Education Specialists: www.diabeteseducator.org Contact a health care provider if: You have a medical condition that increases your risk of infection and you have any cuts, sores, or bruises on your feet. You have an injury that is not healing. You have redness on your legs or feet. You feel burning or tingling in your legs or feet. You have pain or cramps in your legs and feet. Your legs or feet are numb. Your feet always feel cold. You have pain around any toenails. Get help right away if: You have a wound, scrape, corn, or  callus on your foot and: You have pain, swelling, or redness that gets worse. You have fluid or blood coming from the wound, scrape, corn, or callus. Your wound, scrape, corn, or callus feels warm to the touch. You have pus or a bad smell coming from the wound, scrape, corn, or callus. You have a fever. You have a red line going up your leg. Summary Check your feet every day for blisters, cuts, bruises, sores, and redness. Apply a moisturizing lotion or petroleum jelly to the skin on your feet and to dry, brittle toenails. Wear shoes that fit properly and have enough cushioning. If you have foot problems, report any cuts, sores, or bruises to your health care provider immediately. Schedule a complete foot exam at least once a year (annually) or more often if you have foot problems. This information is not intended to replace advice given to you by your health care provider. Make sure you discuss any questions you have with your health care provider. Document Revised: 10/13/2019 Document Reviewed: 10/13/2019 Elsevier Patient Education  2023 Elsevier Inc.   Gabapentin Capsules or Tablets What is this medication? GABAPENTIN (GA ba pen tin) treats nerve pain. It may also be used to prevent and control seizures in people with epilepsy. It works by calming overactive nerves in your body. This medicine may be used for other purposes; ask your health care provider or pharmacist if you have questions. COMMON BRAND NAME(S): Active-PAC with Gabapentin, Ascencion Dike, Gralise, Neurontin What should I tell my care team before I take this medication? They need to know if you have any of these conditions: Alcohol or substance use disorder Kidney disease Lung or breathing disease Suicidal thoughts, plans, or attempt; a previous suicide attempt by you or a family member An unusual or allergic reaction to gabapentin, other medications, foods, dyes, or preservatives Pregnant or trying to get  pregnant Breast-feeding How should I use this medication? Take this medication by mouth with a glass of water. Follow the directions on the prescription label. You can take it with or without food. If it upsets your stomach, take it with food. Take your medication at regular intervals. Do not take it more often than directed. Do not stop taking except on your care team's advice. If you are directed to break the 600 or 800 mg tablets in half as part of your dose, the extra half tablet should be used for the next dose. If you have not used the extra half tablet within 28 days, it should be thrown away. A special MedGuide will be given to you by the pharmacist with each prescription and refill. Be sure to read this information carefully each time. Talk to your care team about the use of this medication in children. While this medication may be prescribed for children as young as 3 years for  selected conditions, precautions do apply. Overdosage: If you think you have taken too much of this medicine contact a poison control center or emergency room at once. NOTE: This medicine is only for you. Do not share this medicine with others. What if I miss a dose? If you miss a dose, take it as soon as you can. If it is almost time for your next dose, take only that dose. Do not take double or extra doses. What may interact with this medication? Alcohol Antihistamines for allergy, cough, and cold Certain medications for anxiety or sleep Certain medications for depression like amitriptyline, fluoxetine, sertraline Certain medications for seizures like phenobarbital, primidone Certain medications for stomach problems General anesthetics like halothane, isoflurane, methoxyflurane, propofol Local anesthetics like lidocaine, pramoxine, tetracaine Medications that relax muscles for surgery Opioid medications for pain Phenothiazines like chlorpromazine, mesoridazine, prochlorperazine, thioridazine This list may  not describe all possible interactions. Give your health care provider a list of all the medicines, herbs, non-prescription drugs, or dietary supplements you use. Also tell them if you smoke, drink alcohol, or use illegal drugs. Some items may interact with your medicine. What should I watch for while using this medication? Visit your care team for regular checks on your progress. You may want to keep a record at home of how you feel your condition is responding to treatment. You may want to share this information with your care team at each visit. You should contact your care team if your seizures get worse or if you have any new types of seizures. Do not stop taking this medication or any of your seizure medications unless instructed by your care team. Stopping your medication suddenly can increase your seizures or their severity. This medication may cause serious skin reactions. They can happen weeks to months after starting the medication. Contact your care team right away if you notice fevers or flu-like symptoms with a rash. The rash may be red or purple and then turn into blisters or peeling of the skin. Or, you might notice a red rash with swelling of the face, lips or lymph nodes in your neck or under your arms. Wear a medical identification bracelet or chain if you are taking this medication for seizures. Carry a card that lists all your medications. This medication may affect your coordination, reaction time, or judgment. Do not drive or operate machinery until you know how this medication affects you. Sit up or stand slowly to reduce the risk of dizzy or fainting spells. Drinking alcohol with this medication can increase the risk of these side effects. Your mouth may get dry. Chewing sugarless gum or sucking hard candy, and drinking plenty of water may help. Watch for new or worsening thoughts of suicide or depression. This includes sudden changes in mood, behaviors, or thoughts. These changes can  happen at any time but are more common in the beginning of treatment or after a change in dose. Call your care team right away if you experience these thoughts or worsening depression. If you become pregnant while using this medication, you may enroll in the [place] Pregnancy Registry by calling [phone]. This registry collects information about the safety of antiepileptic medication use during pregnancy. What side effects may I notice from receiving this medication? Side effects that you should report to your care team as soon as possible: Allergic reactions or angioedema--skin rash, itching, hives, swelling of the face, eyes, lips, tongue, arms, or legs, trouble swallowing or breathing Rash, fever, and  swollen lymph nodes Thoughts of suicide or self harm, worsening mood, feelings of depression Trouble breathing Unusual changes in mood or behavior in children after use such as difficulty concentrating, hostility, or restlessness Side effects that usually do not require medical attention (report to your care team if they continue or are bothersome): Dizziness Drowsiness Nausea Swelling of ankles, feet, or hands Vomiting This list may not describe all possible side effects. Call your doctor for medical advice about side effects. You may report side effects to FDA at 1-800-FDA-1088. Where should I keep my medication? Keep out of reach of children and pets. Store at room temperature between 15 and 30 degrees C (59 and 86 degrees F). Get rid of any unused medication after the expiration date. This medication may cause accidental overdose and death if taken by other adults, children, or pets. To get rid of medications that are no longer needed or have expired: Take the medication to a medication take-back program. Check with your pharmacy or law enforcement to find a location. If you cannot return the medication, check the label or package insert to see if the  medication should be thrown out in the garbage or flushed down the toilet. If you are not sure, ask your care team. If it is safe to put it in the trash, empty the medication out of the container. Mix the medication with cat litter, dirt, coffee grounds, or other unwanted substance. Seal the mixture in a bag or container. Put it in the trash. NOTE: This sheet is a summary. It may not cover all possible information. If you have questions about this medicine, talk to your doctor, pharmacist, or health care provider.  2023 Elsevier/Gold Standard (2020-03-27 00:00:00)

## 2022-01-29 ENCOUNTER — Telehealth: Payer: Self-pay | Admitting: *Deleted

## 2022-01-29 NOTE — Telephone Encounter (Signed)
Manuela Schwartz w/ [facility] is calling for prior Authorization for ABI for scheduled patient (01/30/22), please contact when complete at : [phone].

## 2022-01-30 ENCOUNTER — Ambulatory Visit (HOSPITAL_COMMUNITY)
Admission: RE | Admit: 2022-01-30 | Discharge: 2022-01-30 | Disposition: A | Discharge status: Home / Self Care | Payer: No Typology Code available for payment source | Source: Ambulatory Visit | Attending: Podiatry | Admitting: Podiatry

## 2022-01-30 DIAGNOSIS — R209 Unspecified disturbances of skin sensation: Secondary | ICD-10-CM | POA: Insufficient documentation

## 2022-02-17 NOTE — Telephone Encounter (Signed)
Patient has already had this done already.

## 2022-04-02 ENCOUNTER — Ambulatory Visit (HOSPITAL_COMMUNITY)
Admission: RE | Admit: 2022-04-02 | Discharge: 2022-04-02 | Disposition: A | Discharge status: Home / Self Care | Payer: Medicare Other | Source: Ambulatory Visit | Attending: Family Medicine | Admitting: Family Medicine

## 2022-04-02 ENCOUNTER — Other Ambulatory Visit (HOSPITAL_COMMUNITY): Payer: Self-pay | Admitting: Family Medicine

## 2022-04-02 DIAGNOSIS — M25521 Pain in right elbow: Secondary | ICD-10-CM | POA: Diagnosis present

## 2022-04-22 ENCOUNTER — Ambulatory Visit: Payer: Non-veteran care | Admitting: Podiatry

## 2022-04-28 ENCOUNTER — Ambulatory Visit: Payer: Non-veteran care | Admitting: Podiatry

## 2024-05-05 ENCOUNTER — Emergency Department (HOSPITAL_COMMUNITY)

## 2024-05-05 ENCOUNTER — Encounter (HOSPITAL_COMMUNITY): Payer: Self-pay | Admitting: Emergency Medicine

## 2024-05-05 ENCOUNTER — Other Ambulatory Visit: Payer: Self-pay

## 2024-05-05 ENCOUNTER — Emergency Department (HOSPITAL_COMMUNITY)
Admission: EM | Admit: 2024-05-05 | Discharge: 2024-05-05 | Disposition: A | Discharge status: Home / Self Care | Attending: Emergency Medicine | Admitting: Emergency Medicine

## 2024-05-05 DIAGNOSIS — W000XXA Fall on same level due to ice and snow, initial encounter: Secondary | ICD-10-CM | POA: Insufficient documentation

## 2024-05-05 DIAGNOSIS — W19XXXA Unspecified fall, initial encounter: Secondary | ICD-10-CM

## 2024-05-05 DIAGNOSIS — M25512 Pain in left shoulder: Secondary | ICD-10-CM | POA: Insufficient documentation

## 2024-05-05 MED ORDER — DICLOFENAC EPOLAMINE 1.3 % EX PTCH: 1.0000 | MEDICATED_PATCH | Freq: Two times a day (BID) | CUTANEOUS | 1 refills | Status: AC

## 2024-05-05 MED ORDER — DICLOFENAC EPOLAMINE 1.3 % EX PTCH
1.0000 | MEDICATED_PATCH | Freq: Once | CUTANEOUS | Status: DC
  Administered 2024-05-05: 1 via TRANSDERMAL
  Filled 2024-05-05: qty 1

## 2024-05-05 NOTE — ED Triage Notes (Signed)
 Pt states he slipped and fell on ice last night landing on his left shoulder. Denies hitting his head.

## 2024-05-05 NOTE — ED Notes (Addendum)
 See triage notes. Received report from Gene Autry. Pt resting. Nad. Rom limited due to pain.

## 2024-05-05 NOTE — Discharge Instructions (Signed)
 As discussed, today's evaluation has been generally reassuring.  You likely sustained a sprain or contusion of your shoulder, possibly the AC joint.  Please follow-up with her orthopedic colleagues or return here for concerning changes in your condition.

## 2024-05-05 NOTE — ED Provider Notes (Signed)
 " [place] [facility] AT [facility] Provider Note   CSN: [id number] Arrival date & time: 05/05/24  1010     Patient presents with: Pedro Garcia is a 79 y.o. male.   HPI Presents with his wife who assists with the history.  Yesterday the patient slipped on ice, fell, landing on his left shoulder.  Since that time he has had pain in the left shoulder.  No head trauma, no subsequent headache, weakness in any extremity.  No elbow pain, wrist pain, or other complaints. Patient is generally well.    Prior to Admission medications  Medication Sig Start Date End Date Taking? Authorizing Provider  diclofenac  (FLECTOR ) 1.3 % [facility] Place 1 patch onto the skin 2 (two) times daily. 05/05/24  Yes Garrick Charleston, MD  atorvastatin (LIPITOR) 10 MG tablet Take 10 mg by mouth daily.    [provider]  empagliflozin (JARDIANCE) 25 MG TABS tablet Take 1 tablet by mouth daily. 01/09/22   [provider]  gabapentin  (NEURONTIN ) 100 MG capsule Take 1 capsule (100 mg total) by mouth at bedtime. 01/24/22   Gershon Donnice SAUNDERS, DPM  pioglitazone (ACTOS) 15 MG tablet TAKE ONE TABLET BY MOUTH EVERY DAY FOR DIABETES MELLITUS 12/20/21   [provider]    Allergies: Patient has no known allergies.    Review of Systems  Updated Vital Signs BP 121/76   Pulse 95   Temp 98 F (36.7 C) (Oral)   Ht 1.88 m (6' 2)   Wt 78 kg   SpO2 98%   BMI 22.08 kg/m   Physical Exam Vitals and nursing note reviewed.  Constitutional:      General: He is not in acute distress.    Appearance: He is well-developed.  HENT:     Head: Normocephalic and atraumatic.  Eyes:     Conjunctiva/sclera: Conjunctivae normal.  Cardiovascular:     Rate and Rhythm: Normal rate and regular rhythm.     Pulses: Normal pulses.  Pulmonary:     Effort: Pulmonary effort is normal. No respiratory distress.     Breath sounds: No stridor.  Abdominal:     General: There is no distension.   Musculoskeletal:     Comments: Left elbow, wrist unremarkable.  Patient can move left shoulder in all dimensions though with limitation secondary to pain anterior.  Skin:    General: Skin is warm and dry.  Neurological:     Mental Status: He is alert and oriented to person, place, and time.     (all labs ordered are listed, but only abnormal results are displayed) Labs Reviewed - No data to display  EKG: None  Radiology: DG Shoulder Left Port Result Date: 05/05/2024 EXAM: 1 VIEW(S) XRAY OF THE LEFT SHOULDER 05/05/2024 11:08:00 AM COMPARISON: None available. CLINICAL HISTORY: Fall. FINDINGS: BONES AND JOINTS: Glenohumeral joint is normally aligned. No acute fracture. No malalignment. 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 Mild glenohumeral joint space narrowing. SOFT TISSUES: No abnormal calcifications. Visualized lung is unremarkable. Atherosclerotic calcifications of the aortic arch. IMPRESSION: 1. No evidence of acute traumatic injury. Electronically signed by: Waddell Calk MD 05/05/2024 12:01 PM EST RP Workstation: [id number]     Procedures   Medications Ordered in the ED  diclofenac  (FLECTOR ) 1.3 % 1 patch (has no administration in time range)  Medical Decision Making Adult male presents 1 day after mechanical fall with left shoulder pain.  Patient is awake, alert, speaking clearly has distal preserved neurovascular status, has no head trauma, no weakness, reassuring for low suspicion of head trauma, intracranial hemorrhage.  Concern for fracture versus dislocation versus sprain or other contusion. X-ray ordered.  Amount and/or Complexity of Data Reviewed Radiology: ordered and independent interpretation performed. Decision-making details documented in ED Course.  Risk OTC drugs. Prescription drug management.   12:48 PM Patient in no distress, awake, alert.  I reviewed the x-ray,  discussed with him, wife, concern for contusion versus AC sprain, patient has sling, and anti-inflammatories discharged in stable condition.     Final diagnoses:  Fall, initial encounter  Acute pain of left shoulder    ED Discharge Orders          Ordered    diclofenac  (FLECTOR ) 1.3 % [facility]  2 times daily        05/05/24 1248               Garrick Charleston, MD 05/05/24 1248  "
